# Patient Record
Sex: Female | Born: 1980 | Race: White | Hispanic: Yes | Marital: Single | Smoking: Never smoker
Health system: Southern US, Community
[De-identification: ages and names within clinical notes are randomized; demographics above are authoritative.]

## PROBLEM LIST (undated history)

## (undated) DIAGNOSIS — Z8669 Personal history of other diseases of the nervous system and sense organs: Secondary | ICD-10-CM

## (undated) HISTORY — DX: Personal history of other diseases of the nervous system and sense organs: Z86.69

---
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
## 2002-11-25 ENCOUNTER — Emergency Department (HOSPITAL_COMMUNITY): Admission: EM | Admit: 2002-11-25 | Discharge: 2002-11-25 | Payer: Self-pay | Admitting: *Deleted

## 2004-01-12 ENCOUNTER — Inpatient Hospital Stay (HOSPITAL_COMMUNITY): Admission: AD | Admit: 2004-01-12 | Discharge: 2004-01-13 | Payer: Self-pay | Admitting: Obstetrics and Gynecology

## 2004-03-29 ENCOUNTER — Ambulatory Visit (HOSPITAL_COMMUNITY): Admission: RE | Admit: 2004-03-29 | Discharge: 2004-03-29 | Payer: Self-pay | Admitting: *Deleted

## 2004-04-27 ENCOUNTER — Ambulatory Visit: Payer: Self-pay | Admitting: Hematology & Oncology

## 2004-06-26 ENCOUNTER — Inpatient Hospital Stay (HOSPITAL_COMMUNITY): Admission: AD | Admit: 2004-06-26 | Discharge: 2004-06-26 | Payer: Self-pay | Admitting: Obstetrics & Gynecology

## 2004-06-30 ENCOUNTER — Ambulatory Visit: Payer: Self-pay | Admitting: Hematology & Oncology

## 2004-07-14 ENCOUNTER — Ambulatory Visit: Payer: Self-pay | Admitting: Obstetrics & Gynecology

## 2004-07-14 ENCOUNTER — Inpatient Hospital Stay (HOSPITAL_COMMUNITY): Admission: AD | Admit: 2004-07-14 | Discharge: 2004-07-18 | Payer: Self-pay | Admitting: Obstetrics & Gynecology

## 2004-08-23 ENCOUNTER — Ambulatory Visit: Payer: Self-pay | Admitting: Hematology & Oncology

## 2004-12-26 ENCOUNTER — Inpatient Hospital Stay (HOSPITAL_COMMUNITY): Admission: AD | Admit: 2004-12-26 | Discharge: 2004-12-26 | Payer: Self-pay | Admitting: Obstetrics & Gynecology

## 2004-12-31 ENCOUNTER — Inpatient Hospital Stay (HOSPITAL_COMMUNITY): Admission: AD | Admit: 2004-12-31 | Discharge: 2004-12-31 | Payer: Self-pay | Admitting: Obstetrics & Gynecology

## 2006-07-06 IMAGING — US US OB COMP +14 WK
1 series · 18 of 28 positions shown · non-contrast
Comparison: none

CLINICAL DATA: Patient is 24 weeks 1 day by LMP. G1 P0.  Assess gestational age and fetal anatomy.

[Series 1: us ob comp +14 wk · 18 of 69 slices shown]
[im 1/69]
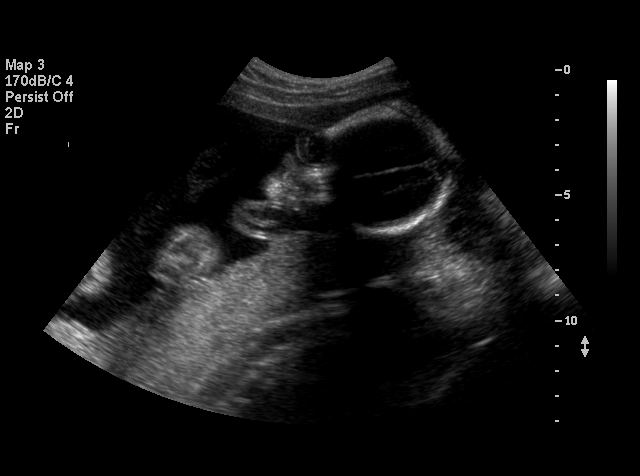
[im 6/69]
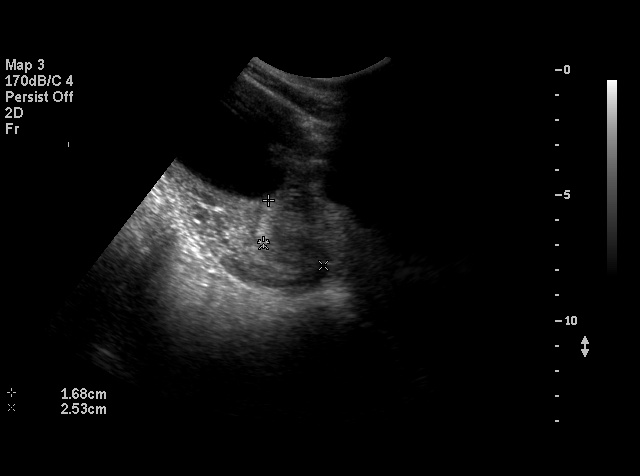
[im 8/69]
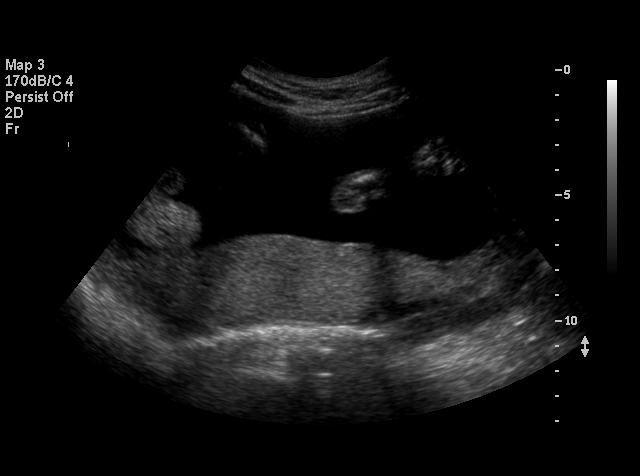
[im 13/69]
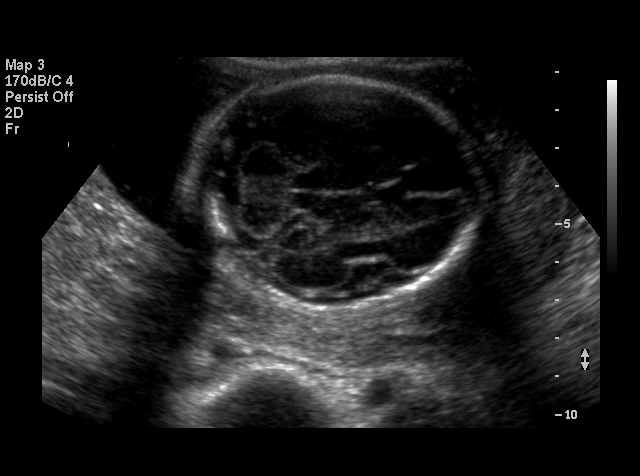
[im 18/69]
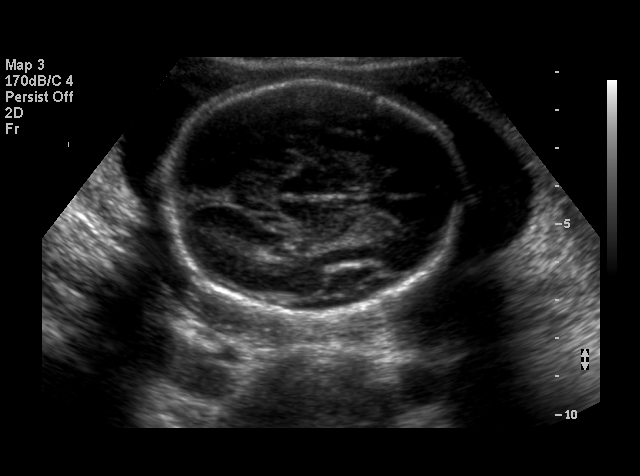
[im 21/69]
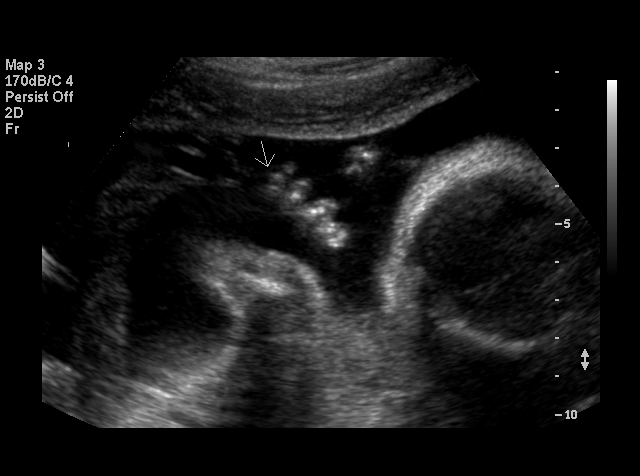
[im 26/69]
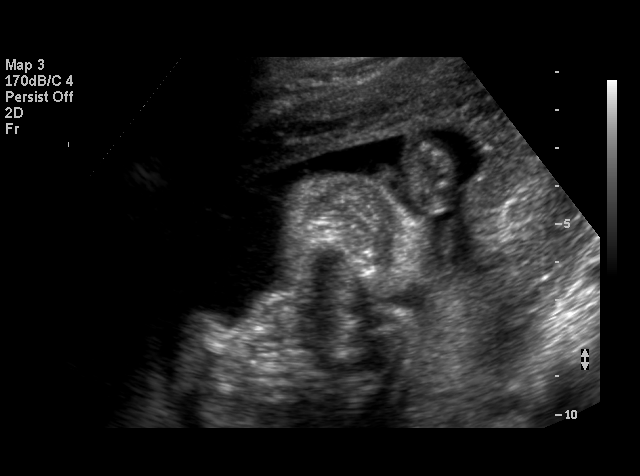
[im 28/69]
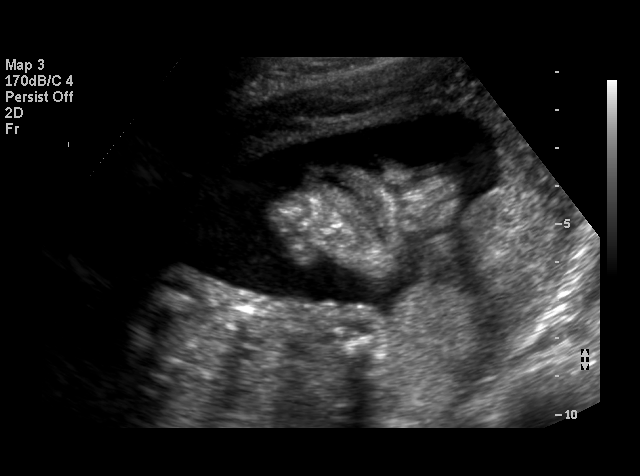
[im 33/69]
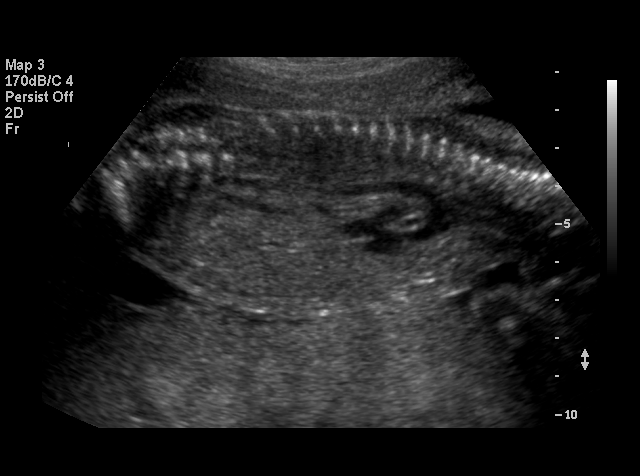
[im 36/69]
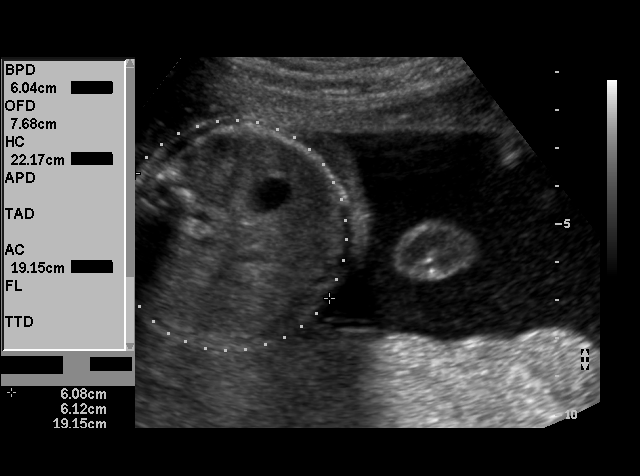
[im 41/69]
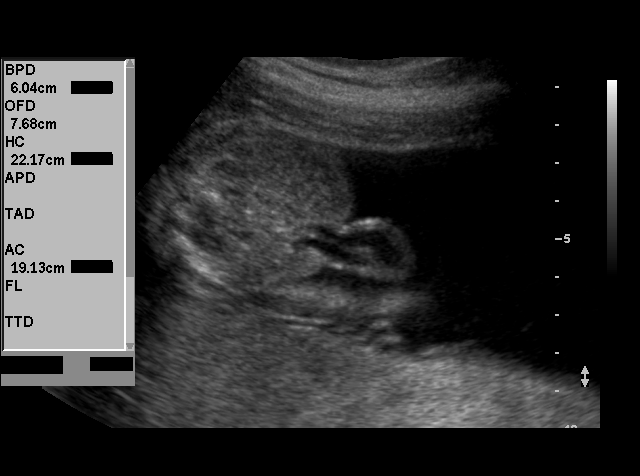
[im 43/69]
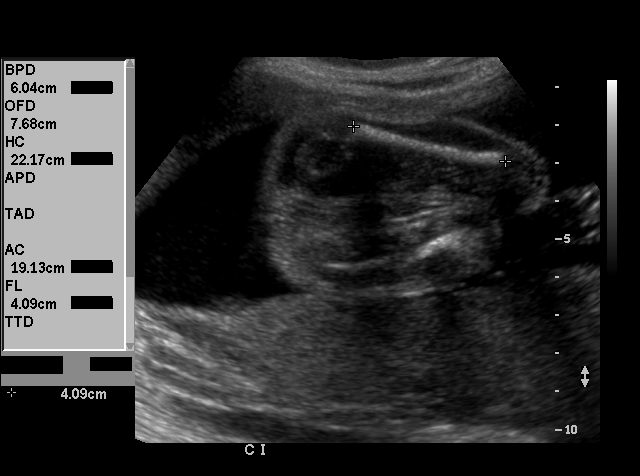
[im 48/69]
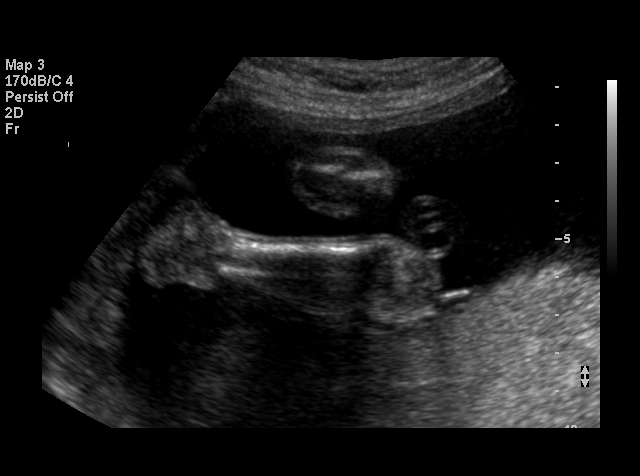
[im 53/69]
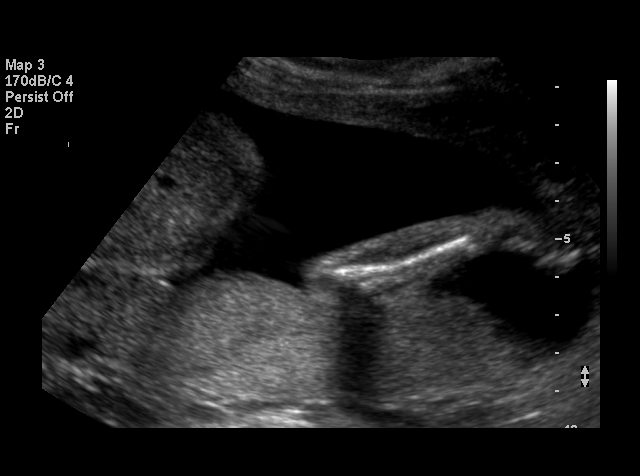
[im 56/69]
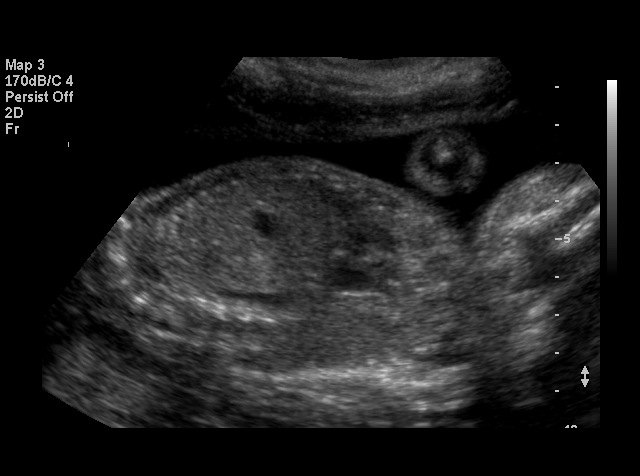
[im 61/69]
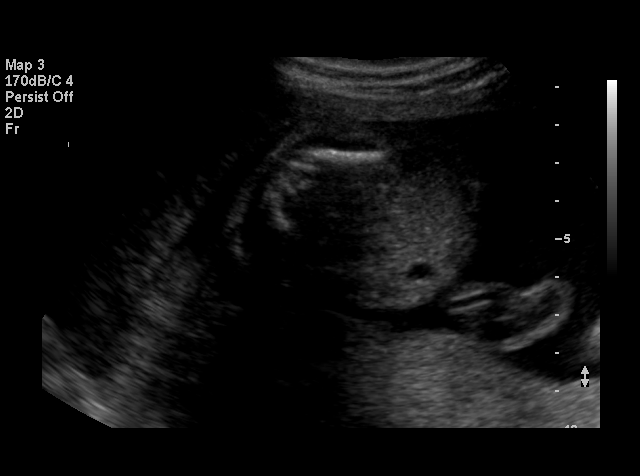
[im 63/69]
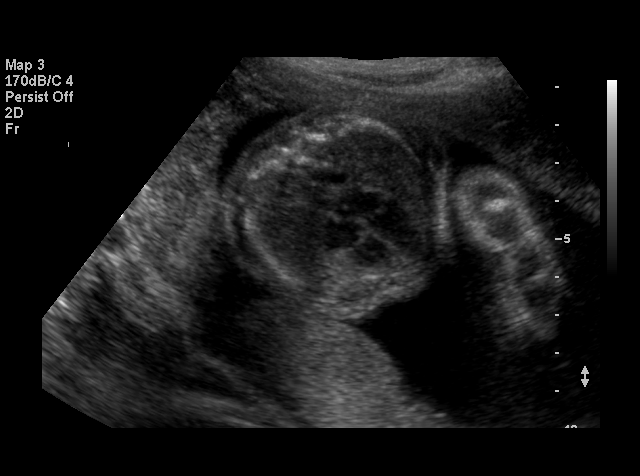
[im 69/69]
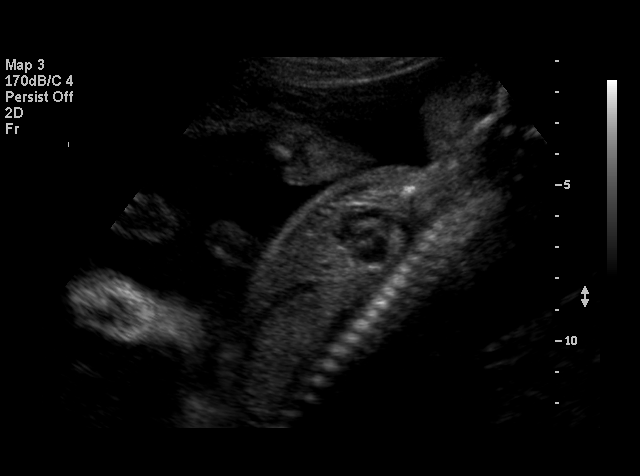

[18 of 28 positions shown; findings below may reference images not displayed]

OBSTETRICAL ULTRASOUND:
 Number of Fetuses: 1
 Heart Rate:  146
 Movement:  Yes
 Breathing:    No  
 Presentation:  Cephalic
 Placental Location:  Posterior
 Grade:  I
 Previa:  No
 Amniotic Fluid (Subjective):  Normal
 Amniotic Fluid (Objective):   5.2 cm Vertical pocket 

 FETAL BIOMETRY
 BPD:   6.0 cm   24 w 4 d
 HC:   22.1 cm   24 w 1 d
 AC:   19.2 cm  24 w 0 d
 FL:    4.1 cm   23 w 2 d

 MEAN GA:  24 w 0 d
 GA BY LMP:  24 w 1 d (Assigned)

 FETAL ANATOMY
 Lateral Ventricles:    Visualized 
 Thalami/CSP:      Visualized 
 Posterior Fossa:  Visualized 
 Nuchal Region:    N/A
 Spine:      Visualized 
 4 Chamber Heart on Left:      Visualized 
 Stomach on Left:      Visualized 
 3 Vessel Cord:    Visualized 
 Cord Insertion site:    Visualized 
 Kidneys:  Visualized 
 Bladder:  Visualized 
 Extremities:      Visualized 

 ADDITIONAL ANATOMY VISUALIZED:  LVOT, upper lip, orbits, profile, diaphragm, 5th digit, ductal arch, aortic arch, and male genitalia.

 MATERNAL UTERINE AND ADNEXAL FINDINGS
 Cervix:   4.1 cm Transabdominally
IMPRESSION: 1.  Single living intrauterine fetus in cephalic presentation.  Patient is 24 weeks 1 day by LMP dating and measures 24 weeks today indicating appropriate dating.
 2.  RVOT and heel not seen.  Otherwise, no anatomic abnormality noted.

 </u12:p>

## 2013-11-04 ENCOUNTER — Ambulatory Visit (INDEPENDENT_AMBULATORY_CARE_PROVIDER_SITE_OTHER): Payer: Self-pay | Admitting: Physician Assistant

## 2013-11-04 ENCOUNTER — Telehealth: Payer: Self-pay

## 2013-11-04 VITALS — BP 98/54 | HR 61 | Temp 98.1°F | Resp 16 | Ht 61.0 in | Wt 115.0 lb

## 2013-11-04 DIAGNOSIS — L309 Dermatitis, unspecified: Secondary | ICD-10-CM

## 2013-11-04 DIAGNOSIS — Z0000 Encounter for general adult medical examination without abnormal findings: Secondary | ICD-10-CM

## 2013-11-04 DIAGNOSIS — J302 Other seasonal allergic rhinitis: Secondary | ICD-10-CM

## 2013-11-04 DIAGNOSIS — R5383 Other fatigue: Secondary | ICD-10-CM

## 2013-11-04 DIAGNOSIS — Z87898 Personal history of other specified conditions: Secondary | ICD-10-CM

## 2013-11-04 DIAGNOSIS — Z8709 Personal history of other diseases of the respiratory system: Secondary | ICD-10-CM

## 2013-11-04 LAB — LIPID PANEL
CHOLESTEROL: 107 mg/dL (ref 0–200)
HDL: 38 mg/dL — AB (ref >39)
LDL CALC: 55 mg/dL (ref 0–99)
Total CHOL/HDL Ratio: 2.8 Ratio
Triglycerides: 72 mg/dL (ref <150)
VLDL: 14 mg/dL (ref 0–40)

## 2013-11-04 LAB — COMPREHENSIVE METABOLIC PANEL
ALBUMIN: 4 g/dL (ref 3.5–5.2)
ALT: 19 U/L (ref 0–35)
AST: 18 U/L (ref 0–37)
Alkaline Phosphatase: 47 U/L (ref 39–117)
BILIRUBIN TOTAL: 0.7 mg/dL (ref 0.2–1.2)
BUN: 12 mg/dL (ref 6–23)
CALCIUM: 9.1 mg/dL (ref 8.4–10.5)
CO2: 24 meq/L (ref 19–32)
Chloride: 104 mEq/L (ref 96–112)
Creat: 0.79 mg/dL (ref 0.50–1.10)
GLUCOSE: 89 mg/dL (ref 70–99)
Potassium: 4.5 mEq/L (ref 3.5–5.3)
Sodium: 138 mEq/L (ref 135–145)
TOTAL PROTEIN: 7.6 g/dL (ref 6.0–8.3)

## 2013-11-04 LAB — TSH: TSH: 2.217 u[IU]/mL (ref 0.350–4.500)

## 2013-11-04 LAB — CBC
HCT: 44.4 % (ref 36.0–46.0)
Hemoglobin: 15 g/dL (ref 12.0–15.0)
MCH: 29.2 pg (ref 26.0–34.0)
MCHC: 33.8 g/dL (ref 30.0–36.0)
MCV: 86.5 fL (ref 78.0–100.0)
Platelets: 109 10*3/uL — ABNORMAL LOW (ref 150–400)
RBC: 5.13 MIL/uL — AB (ref 3.87–5.11)
RDW: 12.4 % (ref 11.5–15.5)
WBC: 5.8 10*3/uL (ref 4.0–10.5)

## 2013-11-04 MED ORDER — CETIRIZINE HCL 10 MG PO TABS: 10.0000 mg | ORAL_TABLET | Freq: Every day | ORAL | Status: AC

## 2013-11-04 MED ORDER — ALBUTEROL SULFATE HFA 108 (90 BASE) MCG/ACT IN AERS
2.0000 | INHALATION_SPRAY | RESPIRATORY_TRACT | Status: AC | PRN
Start: 2013-11-04 — End: ?

## 2013-11-04 NOTE — Patient Instructions (Addendum)
Please take Zyrtec daily for seasonal allergies. If you get hives, please take a Benadryl. If you experience, wheezing, please use inhaler. In case of serious allergic reaction as discussed, please call 911 or if able to, [facility].  Please use OTC hydrocortisone twice daily for eczema for 1 week. Return to clinic if symptoms worsen, fail to resolve in 1 week or as needed.  Keeping You Healthy  Get These Tests 1. Blood Pressure- Have your blood pressure checked once a year by your health care provider.  Normal blood pressure is 120/80. 2. Weight- Have your body mass index (BMI) calculated to screen for obesity.  BMI is measure of body fat based on height and weight.  You can also calculate your own BMI at https://www.west-esparza.com/www.nhlbisupport.com/bmi/. 3. Cholesterol- Have your cholesterol checked every 5 years starting at age 33 then yearly starting at age 33. 4. Chlamydia, HIV, and other sexually transmitted diseases- Get screened every year until age 33, then within three months of each new sexual provider. 5. Pap Smear- Every 1-3 years; discuss with your health care provider. 6. Mammogram- Every year starting at age 33  Take these medicines  Calcium with Vitamin D-Your body needs 1200 mg of Calcium each day and [phone] IU of Vitamin D daily.  Your body can only absorb 500 mg of Calcium at a time so Calcium must be taken in 2 or 3 divided doses throughout the day.  Multivitamin with folic acid- Once daily if it is possible for you to become pregnant.  Get these Immunizations  Gardasil-Series of three doses; prevents HPV related illness such as genital warts and cervical cancer.  Menactra-Single dose; prevents meningitis.  Tetanus shot- Every 10 years.  Flu shot-Every year.  Take these steps 1. Do not smoke-Your healthcare provider can help you quit.  For tips on how to quit go to www.smokefree.gov or call 1-800 QUITNOW. 2. Be physically active- Exercise 5 days a week for at least 30  minutes.  If you are not already physically active, start slow and gradually work up to 30 minutes of moderate physical activity.  Examples of moderate activity include walking briskly, dancing, swimming, bicycling, etc. 3. Breast Cancer- A self breast exam every month is important for early detection of breast cancer.  For more information and instruction on self breast exams, ask your healthcare provider or [place].eswww.womenshealth.gov/faq/breast-self-exam.cfm. 4. Eat a healthy diet- Eat a variety of healthy foods such as fruits, vegetables, whole grains, low fat milk, low fat cheeses, yogurt, lean meats, poultry and fish, beans, nuts, tofu, etc.  For more information go to www. Thenutritionsource.org 5. Drink alcohol in moderation- Limit alcohol intake to one drink or less per day. Never drink and drive. 6. Depression- Your emotional health is as important as your physical health.  If you're feeling down or losing interest in things you normally enjoy please talk to your healthcare provider about being screened for depression. 7. Dental visit- Brush and floss your teeth twice daily; visit your dentist twice a year. 8. Eye doctor- Get an eye exam at least every 2 years. 9. Helmet use- Always wear a helmet when riding a bicycle, motorcycle, rollerblading or skateboarding. 10. Safe sex- If you may be exposed to sexually transmitted infections, use a condom. 11. Seat belts- Seat belts can save your live; always wear one. 12. Smoke/Carbon Monoxide detectors- These detectors need to be installed on the appropriate level of your home. Replace batteries at least once a year. 13. Skin cancer-  When out in the sun please cover up and use sunscreen 15 SPF or higher. 14. Violence- If anyone is threatening or hurting you, please tell your healthcare provider.

## 2013-11-04 NOTE — Telephone Encounter (Signed)
Patient is calling back to get results she was told by Jordan LikesPA-Mario Mani to call back this afternoon for results. Please advise. Patient is concerned, if possible please call today because tomorrow she works and its difficult to reach patient. She speaks both Alban[place]lish and spanish.   Best: [phone]

## 2013-11-04 NOTE — Progress Notes (Signed)
IDENTIFYING INFORMATION  Orie FishermanAlicia Pacheco-Munoz / DOB: July 26, 1980 / MRN: [id number]  SUBJECTIVE  Ms. Lucillie Garfinkelacheco-Munoz is a 33 y.o. year old female presenting as a new patient at [facility] for an annual physical exam.  Medical Team includes:  PCP: Gurney MaxinMike Numair Masden, PA-C at [facility] Eye Care: [employer], wears glasses, last visit ~2 months ago, no significant findings Dental Care: patient cannot recall provider, dental cleanings every year, last visit 4 months ago. [facility], last pap smear 1 year ago, normal findings, also had HIV, STI testing all was negative.  Immunizations: TDap ~4 years ago. Flu vaccine every year, due next year.  Concerns:  Fatigue - reports a 4 week history of fatigue, worse in the morning and day. Associated symptoms include intolerance to cold. Denies dry skin, constipation, depressed mood, difficulty swallowing, weight gain, hair thinning, any previous problems with her thyroid, history of anemia, pallor, heavy menstrual flow, restless legs, pika. Given her family history, patient is concerned that she may have thyroid disease.  Rash - reports a 4 week history of rash over her right cheek. Rash is itchy, dry and uncomfortable. Denies pustules, erythema, pain, tingling, spreading of rash. States that she has not changed cleaning or hygiene products,no new foods, clothes. She has not had exposure to others with similar rash. Of note, patient reports intermittent breakout of hives during fall and winter, worsened with spending time/running outside. Currently she is asymptomatic. Reports that she feels wheezing at times as well when she gets hives. Usually resolve on their own without any medications. Denies history of seasonal allergies, congestion, rhinorrhea, sinus pain, itchy watery eyes, throat swelling, difficulty breathing.  Denies any other aggravating or relieving factors, no other questions or concerns.  No current medications.  No known  drug or food allergies.  There are no active problems to display for this patient.  Past Medical History  Diagnosis Date  . History of migraine headaches 1990   No previous surgeries.  Family History  Problem Relation Age of Onset  . Hypothyroidism Sister   . Hyperthyroidism Sister    History  Substance Use Topics  . Smoking status: Never Smoker   . Smokeless tobacco: Never Used  . Alcohol Use: No   History   Social History Narrative   Single mother, 179 y/o son, Madaline GuthrieFernando (DOB 2006), lives at home with son and mother. Works in [employer], Conservation officer, naturecashier, exercises 5 times a week, eats healthily.     Review of Systems  Constitutional: Positive for malaise/fatigue (as in HPI). Negative for fever, chills and weight loss.  HENT: Negative for congestion, ear pain, hearing loss, sore throat and tinnitus.   Eyes: Negative for blurred vision, double vision, pain and redness.  Respiratory: Positive for wheezing (as in HPI). Negative for cough and shortness of breath.   Cardiovascular: Negative for chest pain, palpitations, orthopnea and leg swelling.  Gastrointestinal: Negative for nausea, vomiting, abdominal pain, diarrhea, constipation and blood in stool.  Genitourinary: Negative for dysuria, hematuria and flank pain.  Musculoskeletal: Negative for myalgias, back pain and joint pain.  Skin: Positive for rash (as in HPI).  Neurological: Negative for dizziness, weakness and headaches.  Endo/Heme/Allergies: Negative for polydipsia.  Psychiatric/Behavioral: Negative for depression. The patient does not have insomnia.    OBJECTIVE  BP 98/54 mmHg  Pulse 61  Temp(Src) 98.1 F (36.7 C) (Oral)  Resp 16  Ht 5\' 1"  (1.549 m)  Wt 115 lb (52.164 kg)  BMI 21.74 kg/m2  SpO2  100%  LMP 10/10/2013  Physical Exam  Constitutional: She is oriented to person, place, and time and well-developed, well-nourished, and in no distress. No distress.  HENT:  Head: Normocephalic.  Right Ear: External ear  normal.  Left Ear: External ear normal.  Nose: Nose normal.  Mouth/Throat: Oropharynx is clear and moist. No oropharyngeal exudate.  Eyes: Conjunctivae are normal. Pupils are equal, round, and reactive to light. Right eye exhibits no discharge. Left eye exhibits no discharge. No scleral icterus.  Neck: Normal range of motion. Neck supple. No thyromegaly present.  Cardiovascular: Normal rate, regular rhythm, normal heart sounds and intact distal pulses.  Exam reveals no gallop and no friction rub.   No murmur heard. Pulmonary/Chest: Effort normal and breath sounds normal. No stridor. No respiratory distress. She has no wheezes. She exhibits no tenderness.  Abdominal: Soft. Bowel sounds are normal. She exhibits no distension and no mass. There is no tenderness.  Genitourinary:  Patient declined. Will continue GYN care with health department.  Musculoskeletal: Normal range of motion. She exhibits no edema or tenderness.  Lymphadenopathy:    She has no cervical adenopathy.  Neurological: She is alert and oriented to person, place, and time. She has normal reflexes.  Skin: Skin is warm and dry. Rash noted. She is not diaphoretic. No erythema.  Psychiatric: Affect normal.  Vitals reviewed.  ASSESSMENT & PLAN  Discussed healthy lifestyle, diet, exercise, preventative care, vaccinations, and addressed patient's concerns. Plan for follow up in 1 year. Otherwise, plan for specific conditions below.  1. PE (physical exam), annual - CBC - Comprehensive metabolic panel - Lipid panel - TSH - Vitamin D 1,25 dihydroxy  3. Eczema of face Return to clinic if symptoms worsen, fail to improve in 1 week - OTC low potency hydrocortisone, thin layer to affected area BID for 1 week, return to clinic if symptoms worsen, fail to resolve or as needed   2. Seasonal allergies Possible source of urticaria, return to clinic if symptoms worsen, fail to resolve or as needed, consider allergy testing if  persistent symptoms. - cetirizine (ZYRTEC) 10 MG tablet; Take 1 tablet (10 mg total) by mouth daily.  Dispense: 30 tablet; Refill: 11 - albuterol (PROVENTIL HFA;VENTOLIN HFA) 108 (90 BASE) MCG/ACT inhaler; Inhale 2 puffs into the lungs every 4 (four) hours as needed for wheezing or shortness of breath (cough, shortness of breath or wheezing.).  Dispense: 1 Inhaler; Refill: 1  4. History of wheezing - Rx albuterol inhaler PRN for possible seasonal allergies or exercise-induced asthma, return to clinic if symptoms worsen, fail to resolve or as needed   Wallis BambergMario Shacora Zynda, PA-C [facility] [phone] 11/04/2013 2:22 PM

## 2013-11-06 NOTE — Progress Notes (Signed)
I have discussed this case with Mr. Mani, PA-C and agree.  

## 2013-11-07 LAB — VITAMIN D 1,25 DIHYDROXY
Vitamin D 1, 25 (OH)2 Total: 61 pg/mL (ref 18–72)
Vitamin D2 1, 25 (OH)2: <8 pg/mL
Vitamin D3 1, 25 (OH)2: 61 pg/mL

## 2013-11-10 NOTE — Telephone Encounter (Signed)
Pt given lab results 

## 2013-11-10 NOTE — Telephone Encounter (Signed)
Told patient her labs looks ok except platelets were slightly low.  She should recheck in the next 2 weeks for repeat CBC to make sure that has improved.

## 2013-11-12 ENCOUNTER — Encounter: Payer: Self-pay | Admitting: Urgent Care

## 2013-11-12 ENCOUNTER — Telehealth: Payer: Self-pay | Admitting: Urgent Care

## 2013-11-12 NOTE — Telephone Encounter (Signed)
Attempted to call patient twice today to discuss results and schedule follow up at [facility]. Will attempt call again 11/13/2013.  Wallis BambergMario Axell Trigueros, PA-C [facility] [phone] 11/12/2013 9:53 AM

## 2013-11-13 ENCOUNTER — Telehealth: Payer: Self-pay | Admitting: Urgent Care

## 2013-11-13 NOTE — Telephone Encounter (Signed)
Called patient at 19:00. Unable to leave message, VM not set up. Will try again in a few days.  Caitlin BambergMario Kati Riggenbach, PA-C [facility] [phone] 11/12/2013 9:53 AM

## 2013-11-13 NOTE — Telephone Encounter (Signed)
Unable to leave voice message. Will attempt another call later this evening.  Wallis BambergMario Diondra Pines, PA-C [facility] [phone] 11/12/2013 9:53 AM

## 2013-11-14 ENCOUNTER — Encounter: Payer: Self-pay | Admitting: Urgent Care

## 2013-11-14 ENCOUNTER — Telehealth: Payer: Self-pay | Admitting: Urgent Care

## 2013-11-14 NOTE — Telephone Encounter (Signed)
Still unable to reach patient. Will send a letter to return for recheck of her platelets in the next 2 weeks.  Wallis BambergMario Jeronica Stlouis, PA-C [facility] [phone] 11/12/2013 9:53 AM

## 2013-11-15 ENCOUNTER — Telehealth: Payer: Self-pay | Admitting: *Deleted

## 2013-11-15 NOTE — Telephone Encounter (Signed)
Tanda RockersKaya A Jackson at 11/14/2013 3:51 PM     Status: Signed       Expand All Collapse All   Patient is calling to return several missed phone calls. She doesn't know who called or what the phone calls were in regards to. Please call patient back! [phone]

## 2013-11-15 NOTE — Telephone Encounter (Signed)
No answer no vm

## 2013-11-16 NOTE — Telephone Encounter (Signed)
Letter sent to pt to advise to rtc in 2 weeks to recheck platelets. Unable to LM- VM not set up.

## 2016-12-31 ENCOUNTER — Other Ambulatory Visit: Payer: Self-pay | Admitting: Internal Medicine

## 2016-12-31 ENCOUNTER — Ambulatory Visit
Admission: RE | Admit: 2016-12-31 | Discharge: 2016-12-31 | Disposition: A | Discharge status: Home / Self Care | Payer: No Typology Code available for payment source | Source: Ambulatory Visit | Attending: Internal Medicine | Admitting: Internal Medicine

## 2016-12-31 DIAGNOSIS — Z111 Encounter for screening for respiratory tuberculosis: Secondary | ICD-10-CM

## 2017-09-05 ENCOUNTER — Encounter: Payer: Self-pay | Admitting: Emergency Medicine

## 2017-09-11 ENCOUNTER — Encounter: Payer: Self-pay | Admitting: Emergency Medicine

## 2019-04-09 IMAGING — CR DG CHEST 1V
1 series · 1 of 1 positions shown · non-contrast
Comparison: None.

CLINICAL DATA: Positive PPD.

EXAM:
CHEST 1 VIEW

[w chest pa]
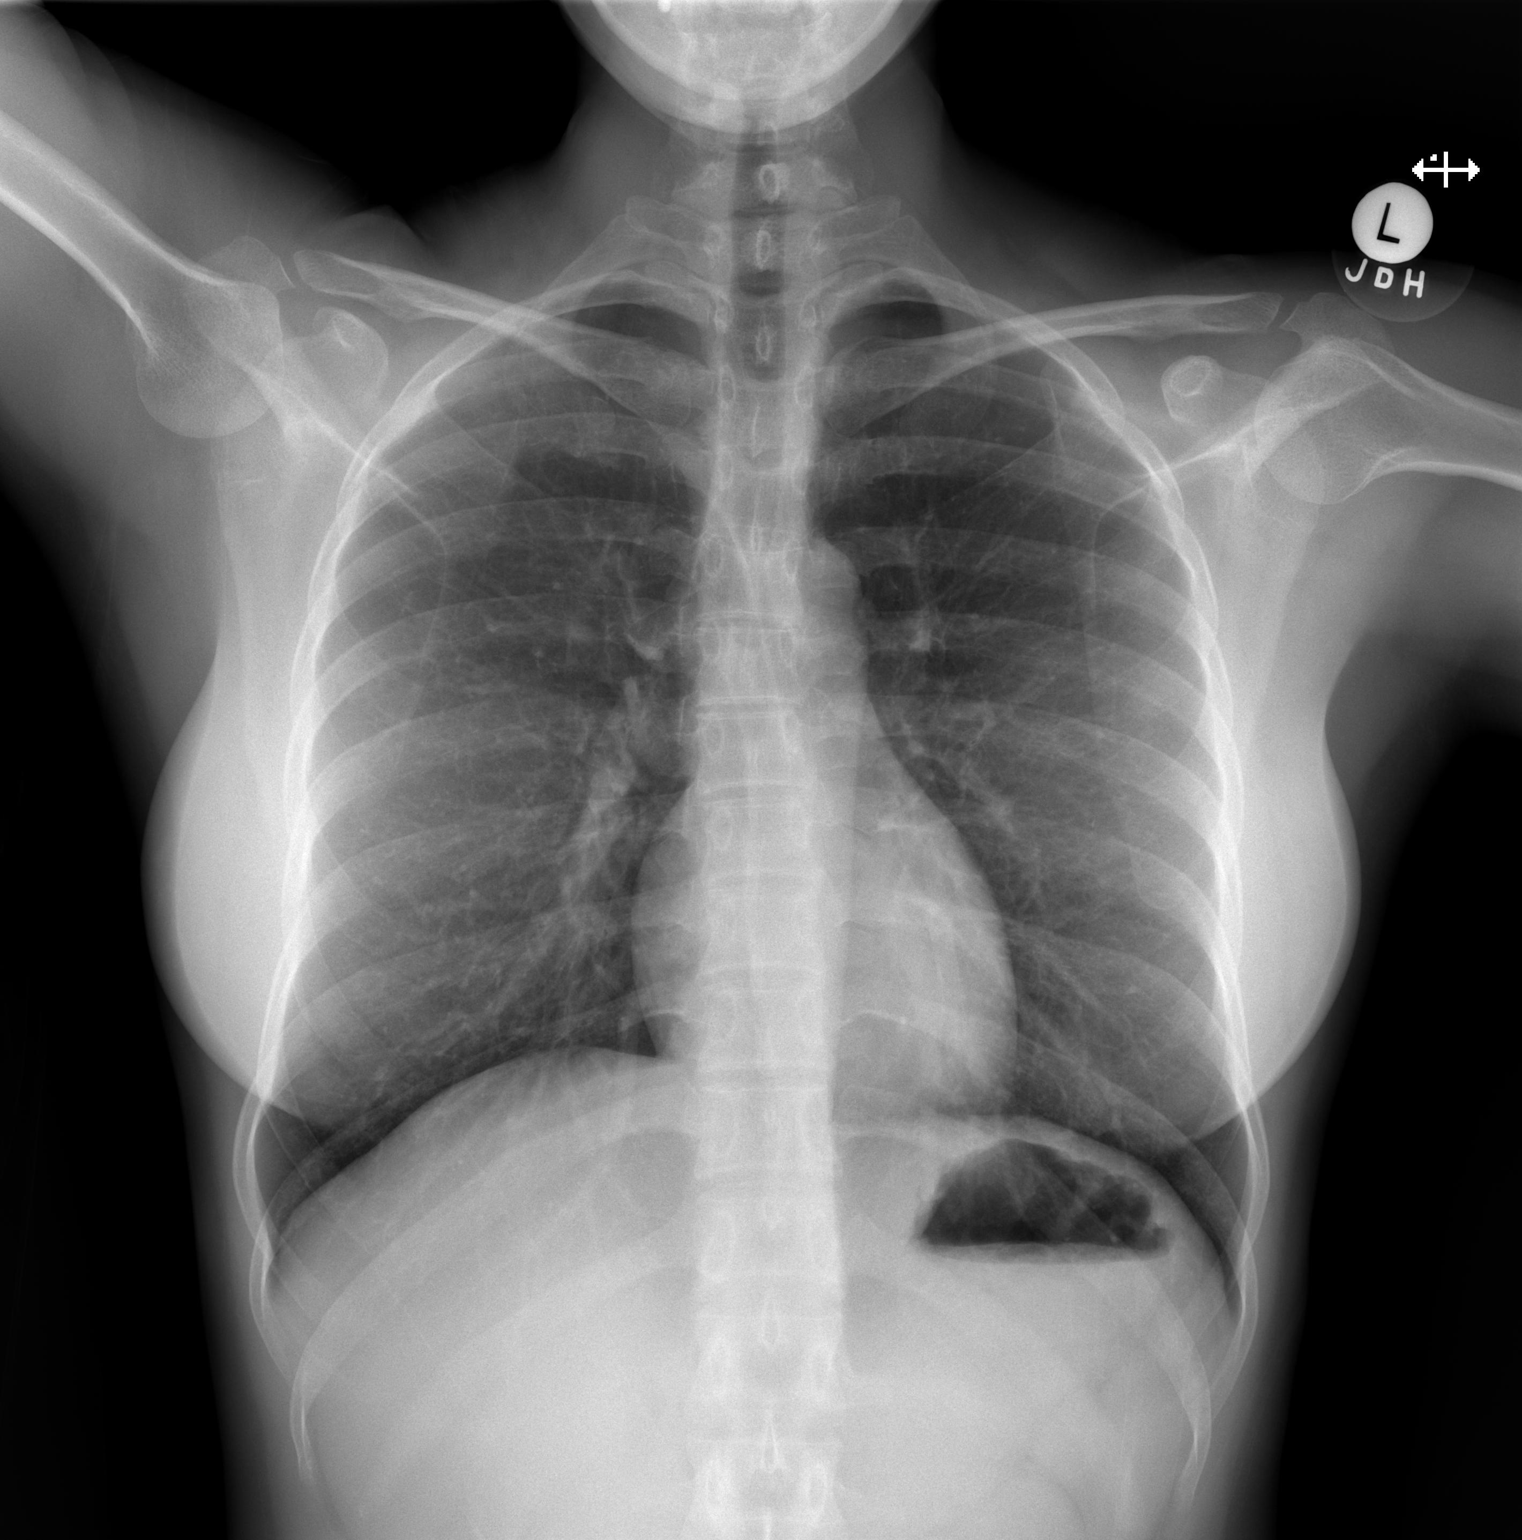

[1 of 1 positions shown; findings below may reference images not displayed]

FINDINGS: The heart size and mediastinal contours are within normal limits.
Both lungs are clear. The visualized skeletal structures are
unremarkable.
IMPRESSION: Normal exam.

## 2022-08-14 ENCOUNTER — Ambulatory Visit: Payer: Self-pay | Admitting: Emergency Medicine
